# Patient Record
Sex: Male | Born: 1960 | Marital: Single
Health system: Southern US, Community
[De-identification: ages and names within clinical notes are randomized; demographics above are authoritative.]

## PROBLEM LIST (undated history)

## (undated) DIAGNOSIS — I10 Essential (primary) hypertension: Secondary | ICD-10-CM

## (undated) DIAGNOSIS — A419 Sepsis, unspecified organism: Secondary | ICD-10-CM

## (undated) DIAGNOSIS — J151 Pneumonia due to Pseudomonas: Secondary | ICD-10-CM

## (undated) DIAGNOSIS — B9562 Methicillin resistant Staphylococcus aureus infection as the cause of diseases classified elsewhere: Secondary | ICD-10-CM

## (undated) DIAGNOSIS — L89319 Pressure ulcer of right buttock, unspecified stage: Secondary | ICD-10-CM

## (undated) DIAGNOSIS — B965 Pseudomonas (aeruginosa) (mallei) (pseudomallei) as the cause of diseases classified elsewhere: Secondary | ICD-10-CM

## (undated) DIAGNOSIS — L89329 Pressure ulcer of left buttock, unspecified stage: Secondary | ICD-10-CM

## (undated) DIAGNOSIS — Z978 Presence of other specified devices: Secondary | ICD-10-CM

## (undated) DIAGNOSIS — Z8619 Personal history of other infectious and parasitic diseases: Secondary | ICD-10-CM

## (undated) DIAGNOSIS — E119 Type 2 diabetes mellitus without complications: Secondary | ICD-10-CM

## (undated) DIAGNOSIS — J9611 Chronic respiratory failure with hypoxia: Secondary | ICD-10-CM

## (undated) DIAGNOSIS — J189 Pneumonia, unspecified organism: Secondary | ICD-10-CM

## (undated) DIAGNOSIS — G8220 Paraplegia, unspecified: Secondary | ICD-10-CM

## (undated) DIAGNOSIS — G40909 Epilepsy, unspecified, not intractable, without status epilepticus: Secondary | ICD-10-CM

## (undated) DIAGNOSIS — J449 Chronic obstructive pulmonary disease, unspecified: Secondary | ICD-10-CM

## (undated) DIAGNOSIS — Z960 Presence of urogenital implants: Secondary | ICD-10-CM

## (undated) DIAGNOSIS — R7881 Bacteremia: Secondary | ICD-10-CM

## (undated) DIAGNOSIS — N390 Urinary tract infection, site not specified: Secondary | ICD-10-CM

## (undated) HISTORY — PX: TRACHEOSTOMY: SUR1362

---

## 2014-10-02 ENCOUNTER — Ambulatory Visit (HOSPITAL_COMMUNITY)
Admission: RE | Admit: 2014-10-02 | Discharge: 2014-10-02 | Disposition: A | Discharge status: Home / Self Care | Payer: Medicare Other | Source: Ambulatory Visit | Attending: Family Medicine | Admitting: Family Medicine

## 2014-10-02 ENCOUNTER — Ambulatory Visit (HOSPITAL_COMMUNITY): Payer: Medicare Other

## 2014-10-02 DIAGNOSIS — G9340 Encephalopathy, unspecified: Secondary | ICD-10-CM | POA: Diagnosis not present

## 2014-10-02 NOTE — Procedures (Signed)
EEG report.  Brief clinical history:  54 years old with spells versus seizures. Technique: this is a 17 channel routine scalp EEG performed at the bedside with bipolar and monopolar montages arranged in accordance to the international 10/20 system of electrode placement. One channel was dedicated to EKG recording.  The patient is unresponsive, intubated on the vent. No activating procedures performed.  Description: as the study opens and throughout the entire recording, there is marked and diffuse background attenuation with sporadically intermixed theta frequencies. No electrographic seizures noted.  No focal or generalized epileptiform discharges noted.  EKG showed sinus rhythm.  Impression: this is an abnormal EEG with findings consistent with a severe encephalopathy, no specific as to cause. No electrographic seizures noted. Clinical correlation is advised.   Wyatt Portelasvaldo Sherman Donaldson, MD [facility]

## 2014-10-02 NOTE — Progress Notes (Signed)
Off site [facility] completed.  Results pending.

## 2015-12-21 ENCOUNTER — Other Ambulatory Visit (HOSPITAL_COMMUNITY): Payer: Self-pay

## 2015-12-21 ENCOUNTER — Inpatient Hospital Stay
Admission: AD | Admit: 2015-12-21 | Discharge: 2016-02-03 | Disposition: A | Discharge status: Home Health | Payer: Self-pay | Source: Ambulatory Visit | Attending: Internal Medicine | Admitting: Internal Medicine

## 2015-12-21 DIAGNOSIS — Z931 Gastrostomy status: Secondary | ICD-10-CM

## 2015-12-21 DIAGNOSIS — R1310 Dysphagia, unspecified: Secondary | ICD-10-CM

## 2015-12-21 DIAGNOSIS — Z0189 Encounter for other specified special examinations: Secondary | ICD-10-CM

## 2015-12-21 DIAGNOSIS — J9690 Respiratory failure, unspecified, unspecified whether with hypoxia or hypercapnia: Secondary | ICD-10-CM

## 2015-12-21 DIAGNOSIS — Z4659 Encounter for fitting and adjustment of other gastrointestinal appliance and device: Secondary | ICD-10-CM

## 2015-12-21 HISTORY — DX: Chronic obstructive pulmonary disease, unspecified: J44.9

## 2015-12-21 HISTORY — DX: Epilepsy, unspecified, not intractable, without status epilepticus: G40.909

## 2015-12-21 HISTORY — DX: Chronic respiratory failure with hypoxia: J96.11

## 2015-12-21 HISTORY — DX: Pseudomonas (aeruginosa) (mallei) (pseudomallei) as the cause of diseases classified elsewhere: B96.5

## 2015-12-21 HISTORY — DX: Pneumonia due to Pseudomonas: J15.1

## 2015-12-21 HISTORY — DX: Pressure ulcer of right buttock, unspecified stage: L89.319

## 2015-12-21 HISTORY — DX: Pneumonia, unspecified organism: J18.9

## 2015-12-21 HISTORY — DX: Bacteremia: R78.81

## 2015-12-21 HISTORY — DX: Urinary tract infection, site not specified: N39.0

## 2015-12-21 HISTORY — DX: Paraplegia, unspecified: G82.20

## 2015-12-21 HISTORY — DX: Presence of other specified devices: Z97.8

## 2015-12-21 HISTORY — DX: Pressure ulcer of left buttock, unspecified stage: L89.329

## 2015-12-21 HISTORY — DX: Type 2 diabetes mellitus without complications: E11.9

## 2015-12-21 HISTORY — DX: Sepsis, unspecified organism: A41.9

## 2015-12-21 HISTORY — DX: Presence of urogenital implants: Z96.0

## 2015-12-21 HISTORY — DX: Essential (primary) hypertension: I10

## 2015-12-21 HISTORY — DX: Personal history of other infectious and parasitic diseases: Z86.19

## 2015-12-21 HISTORY — DX: Methicillin resistant Staphylococcus aureus infection as the cause of diseases classified elsewhere: B95.62

## 2015-12-21 LAB — BLOOD GAS, ARTERIAL
Acid-Base Excess: 2.2 mmol/L — ABNORMAL HIGH (ref 0.0–2.0)
Bicarbonate: 28.4 meq/L — ABNORMAL HIGH (ref 20.0–24.0)
FIO2: 50
MECHVT: 500 mL
O2 Saturation: 97 %
PEEP: 5 cmH2O
RATE: 16 {breaths}/min
TCO2: 30.3 mmol/L (ref 0–100)
pCO2 arterial: 63.3 mm[Hg] (ref 35.0–45.0)
pH, Arterial: 7.274 — ABNORMAL LOW (ref 7.350–7.450)
pO2, Arterial: 89.8 mm[Hg] (ref 80.0–100.0)

## 2015-12-22 ENCOUNTER — Other Ambulatory Visit (HOSPITAL_COMMUNITY): Payer: Self-pay

## 2015-12-22 LAB — CBC
HCT: 28.2 % — ABNORMAL LOW (ref 39.0–52.0)
Hemoglobin: 8 g/dL — ABNORMAL LOW (ref 13.0–17.0)
MCH: 25.8 pg — ABNORMAL LOW (ref 26.0–34.0)
MCHC: 28.4 g/dL — ABNORMAL LOW (ref 30.0–36.0)
MCV: 91 fL (ref 78.0–100.0)
Platelets: 278 10*3/uL (ref 150–400)
RBC: 3.1 MIL/uL — ABNORMAL LOW (ref 4.22–5.81)
RDW: 16.3 % — ABNORMAL HIGH (ref 11.5–15.5)
WBC: 9.6 10*3/uL (ref 4.0–10.5)

## 2015-12-22 LAB — BRAIN NATRIURETIC PEPTIDE: B Natriuretic Peptide: 44.3 pg/mL (ref 0.0–100.0)

## 2015-12-22 LAB — COMPREHENSIVE METABOLIC PANEL
ALT: 12 U/L — ABNORMAL LOW (ref 17–63)
AST: 23 U/L (ref 15–41)
Albumin: 2.1 g/dL — ABNORMAL LOW (ref 3.5–5.0)
Alkaline Phosphatase: 83 U/L (ref 38–126)
Anion gap: 5 (ref 5–15)
BUN: 58 mg/dL — ABNORMAL HIGH (ref 6–20)
CO2: 29 mmol/L (ref 22–32)
Calcium: 9.3 mg/dL (ref 8.9–10.3)
Chloride: 115 mmol/L — ABNORMAL HIGH (ref 101–111)
Creatinine, Ser: 1.04 mg/dL (ref 0.61–1.24)
GFR calc Af Amer: >60 mL/min (ref >60)
GFR calc non Af Amer: >60 mL/min (ref >60)
Glucose, Bld: 99 mg/dL (ref 65–99)
Potassium: 3.7 mmol/L (ref 3.5–5.1)
Sodium: 149 mmol/L — ABNORMAL HIGH (ref 135–145)
Total Bilirubin: 0.3 mg/dL (ref 0.3–1.2)
Total Protein: 7.8 g/dL (ref 6.5–8.1)

## 2015-12-22 LAB — C DIFFICILE QUICK SCREEN W PCR REFLEX
C Diff antigen: POSITIVE — AB
C Diff toxin: NEGATIVE

## 2015-12-22 LAB — CLOSTRIDIUM DIFFICILE BY PCR: Toxigenic C. Difficile by PCR: NEGATIVE

## 2015-12-22 LAB — PROTIME-INR
INR: 1.19
Prothrombin Time: 15.2 s (ref 11.4–15.2)

## 2015-12-23 ENCOUNTER — Encounter: Payer: Self-pay | Admitting: Acute Care

## 2015-12-23 DIAGNOSIS — E872 Acidosis: Secondary | ICD-10-CM

## 2015-12-23 DIAGNOSIS — J9601 Acute respiratory failure with hypoxia: Secondary | ICD-10-CM

## 2015-12-23 DIAGNOSIS — G9340 Encephalopathy, unspecified: Secondary | ICD-10-CM

## 2015-12-23 DIAGNOSIS — J189 Pneumonia, unspecified organism: Secondary | ICD-10-CM
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
## 2015-12-26 ENCOUNTER — Other Ambulatory Visit (HOSPITAL_COMMUNITY): Payer: Self-pay
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
## 2015-12-28 ENCOUNTER — Encounter (HOSPITAL_COMMUNITY): Payer: Self-pay | Admitting: Certified Registered"

## 2015-12-28 ENCOUNTER — Encounter
Admission: AD | Disposition: A | Discharge status: Home Health | Payer: Self-pay | Source: Ambulatory Visit | Attending: Internal Medicine

## 2015-12-28 HISTORY — PX: TRACHEOSTOMY TUBE PLACEMENT: SHX814

## 2015-12-28 SURGERY — CREATION, TRACHEOSTOMY
Anesthesia: General

## 2015-12-28 MED ORDER — PROPOFOL 10 MG/ML IV BOLUS
INTRAVENOUS | Status: AC
  Filled 2015-12-28: qty 20

## 2015-12-28 MED ORDER — LIDOCAINE-EPINEPHRINE 1 %-1:100000 IJ SOLN
INTRAMUSCULAR | Status: DC | PRN
  Administered 2015-12-28: 5 mL

## 2015-12-28 MED ORDER — ROCURONIUM BROMIDE 100 MG/10ML IV SOLN
INTRAVENOUS | Status: DC | PRN
  Administered 2015-12-28: 100 mg via INTRAVENOUS

## 2015-12-28 MED ORDER — MIDAZOLAM HCL 2 MG/2ML IJ SOLN
INTRAMUSCULAR | Status: DC | PRN
  Administered 2015-12-28: 4 mg via INTRAVENOUS

## 2015-12-28 MED ORDER — EPHEDRINE 5 MG/ML INJ
INTRAVENOUS | Status: AC
  Filled 2015-12-28: qty 10

## 2015-12-28 MED ORDER — FENTANYL CITRATE (PF) 100 MCG/2ML IJ SOLN
INTRAMUSCULAR | Status: AC
  Filled 2015-12-28: qty 4

## 2015-12-28 MED ORDER — MIDAZOLAM HCL 2 MG/2ML IJ SOLN
INTRAMUSCULAR | Status: AC
  Filled 2015-12-28: qty 2

## 2015-12-28 MED ORDER — 0.9 % SODIUM CHLORIDE (POUR BTL) OPTIME
TOPICAL | Status: DC | PRN
  Administered 2015-12-28: 1000 mL

## 2015-12-28 MED ORDER — ROCURONIUM BROMIDE 10 MG/ML (PF) SYRINGE
PREFILLED_SYRINGE | INTRAVENOUS | Status: AC
  Filled 2015-12-28: qty 10

## 2015-12-28 MED ORDER — ONDANSETRON HCL 4 MG/2ML IJ SOLN
INTRAMUSCULAR | Status: DC | PRN
  Administered 2015-12-28: 4 mg via INTRAVENOUS

## 2015-12-28 MED ORDER — PHENYLEPHRINE HCL 10 MG/ML IJ SOLN
INTRAMUSCULAR | Status: DC | PRN
  Administered 2015-12-28: 120 ug via INTRAVENOUS
  Administered 2015-12-28: 80 ug via INTRAVENOUS

## 2015-12-28 MED ORDER — PHENYLEPHRINE HCL 10 MG/ML IJ SOLN
INTRAVENOUS | Status: DC | PRN
  Administered 2015-12-28: 20 ug/min via INTRAVENOUS

## 2015-12-28 MED ORDER — FENTANYL CITRATE (PF) 100 MCG/2ML IJ SOLN
INTRAMUSCULAR | Status: DC | PRN
  Administered 2015-12-28 (×4): 50 ug via INTRAVENOUS

## 2015-12-28 MED ORDER — PHENYLEPHRINE 40 MCG/ML (10ML) SYRINGE FOR IV PUSH (FOR BLOOD PRESSURE SUPPORT)
PREFILLED_SYRINGE | INTRAVENOUS | Status: AC
  Filled 2015-12-28: qty 10

## 2015-12-28 SURGICAL SUPPLY — 41 items
ATTRACTOMAT 16X20 MAGNETIC DRP (DRAPES) IMPLANT
BLADE SURG 15 STRL LF DISP TIS (BLADE) ×1 IMPLANT
BLADE SURG 15 STRL SS (BLADE) ×3
CLEANER TIP ELECTROSURG 2X2 (MISCELLANEOUS) ×3 IMPLANT
COVER SURGICAL LIGHT HANDLE (MISCELLANEOUS) ×3 IMPLANT
DRAPE PROXIMA HALF (DRAPES) ×3 IMPLANT
ELECT COATED BLADE 2.86 ST (ELECTRODE) ×3 IMPLANT
ELECT REM PT RETURN 9FT ADLT (ELECTROSURGICAL) ×3
ELECTRODE REM PT RTRN 9FT ADLT (ELECTROSURGICAL) ×1 IMPLANT
GAUZE SPONGE 4X4 16PLY XRAY LF (GAUZE/BANDAGES/DRESSINGS) ×3 IMPLANT
GEL ULTRASOUND 20GR AQUASONIC (MISCELLANEOUS) ×3 IMPLANT
GLOVE SS BIOGEL STRL SZ 7.5 (GLOVE) ×1 IMPLANT
GLOVE SUPERSENSE BIOGEL SZ 7.5 (GLOVE) ×2
GOWN STRL REUS W/ TWL LRG LVL3 (GOWN DISPOSABLE) ×1 IMPLANT
GOWN STRL REUS W/ TWL XL LVL3 (GOWN DISPOSABLE) ×1 IMPLANT
GOWN STRL REUS W/TWL LRG LVL3 (GOWN DISPOSABLE) ×3
GOWN STRL REUS W/TWL XL LVL3 (GOWN DISPOSABLE) ×3
HOLDER TRACH TUBE VELCRO 19.5 (MISCELLANEOUS) ×3 IMPLANT
KIT BASIN OR (CUSTOM PROCEDURE TRAY) ×3 IMPLANT
KIT ROOM TURNOVER OR (KITS) ×3 IMPLANT
KIT SUCTION CATH 14FR (SUCTIONS) ×3 IMPLANT
NEEDLE HYPO 25GX1X1/2 BEV (NEEDLE) IMPLANT
NS IRRIG 1000ML POUR BTL (IV SOLUTION) ×3 IMPLANT
PACK EENT II TURBAN DRAPE (CUSTOM PROCEDURE TRAY) ×3 IMPLANT
PAD ARMBOARD 7.5X6 YLW CONV (MISCELLANEOUS) IMPLANT
PENCIL BUTTON HOLSTER BLD 10FT (ELECTRODE) ×3 IMPLANT
SPONGE DRAIN TRACH 4X4 STRL 2S (GAUZE/BANDAGES/DRESSINGS) ×3 IMPLANT
SPONGE GAUZE 4X4 12PLY STER LF (GAUZE/BANDAGES/DRESSINGS) ×3 IMPLANT
SPONGE INTESTINAL PEANUT (DISPOSABLE) ×3 IMPLANT
SUT SILK 2 0 SH CR/8 (SUTURE) ×3 IMPLANT
SUT SILK 3 0 TIES 10X30 (SUTURE) IMPLANT
SYR 20CC LL (SYRINGE) ×3 IMPLANT
SYR 5ML LUER SLIP (SYRINGE) ×3 IMPLANT
SYR CONTROL 10ML LL (SYRINGE) ×3 IMPLANT
TOWEL OR 17X24 6PK STRL BLUE (TOWEL DISPOSABLE) ×3 IMPLANT
TOWEL OR 17X26 10 PK STRL BLUE (TOWEL DISPOSABLE) ×3 IMPLANT
TUBE CONNECTING 12'X1/4 (SUCTIONS) ×1
TUBE CONNECTING 12X1/4 (SUCTIONS) ×2 IMPLANT
TUBE TRACH SHILEY  6 DIST  CUF (TUBING) ×3 IMPLANT
TUBE TRACH SHILEY 10 DIST CUFF (TUBING) IMPLANT
TUBE TRACH SHILEY 8 DIST CUF (TUBING) IMPLANT

## 2015-12-28 NOTE — Transfer of Care (Addendum)
Immediate Anesthesia Transfer of Care Note  Patient: Ruben Harrington  Procedure(s) Performed: Procedure(s): TRACHEOSTOMY (N/A)  Patient Location: ICU  Anesthesia Type:General  Level of Consciousness: sedated and unresponsive  Airway & Oxygen Therapy: Patient remains intubated per anesthesia plan and Patient placed on Ventilator (see vital sign flow sheet for setting)  Post-op Assessment: Report given to RN and Post -op Vital signs reviewed and stable  Post vital signs: Reviewed and stable  Last Vitals: There were no vitals filed for this visit.  Last Pain: There were no vitals filed for this visit.       Complications: No apparent anesthesia complications
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
## 2015-12-28 NOTE — Brief Op Note (Signed)
12/21/2015 - 12/28/2015  8:53 AM  PATIENT:  Ruben Harrington  55 y.o. male  PRE-OPERATIVE DIAGNOSIS:  respiratory failure  POST-OPERATIVE DIAGNOSIS:  respiratory failure  PROCEDURE:  Procedure(s): TRACHEOSTOMY (N/A) # 6 Shiley  SURGEON:  Surgeon(s) and Role:    * Drema Halonhristopher E Parag Dorton, MD - Primary  PHYSICIAN ASSISTANT:   ASSISTANTS: none   ANESTHESIA:   general  EBL:  Total I/O In: -  Out: 25 [Blood:25]  BLOOD ADMINISTERED:none  DRAINS: none   LOCAL MEDICATIONS USED:  XYLOCAINE with EPI 5 cc  SPECIMEN:  No Specimen  DISPOSITION OF SPECIMEN:  N/A  COUNTS:  YES  TOURNIQUET:  * No tourniquets in log *  DICTATION: .Other Dictation: Dictation Number [id number]  PLAN OF CARE: Discharge to home after PACU  PATIENT DISPOSITION:  PACU - hemodynamically stable.   Delay start of Pharmacological VTE agent (>24hrs) due to surgical blood loss or risk of bleeding: yes

## 2015-12-28 NOTE — Anesthesia Procedure Notes (Signed)
Date/Time: 12/28/2015 7:45 AM Performed by: Rosiland OzMEYERS, Ricquel Foulk Pre-anesthesia Checklist: Patient identified, Emergency Drugs available, Suction available, Patient being monitored and Timeout performed Patient Re-evaluated:Patient Re-evaluated prior to inductionOxygen Delivery Method: Circle system utilized Preoxygenation: Pre-oxygenation with 100% oxygen Intubation Type: Inhalational induction with existing ETT

## 2015-12-29 ENCOUNTER — Encounter (HOSPITAL_COMMUNITY): Payer: Self-pay | Admitting: Otolaryngology
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
## 2016-01-01 ENCOUNTER — Other Ambulatory Visit (HOSPITAL_COMMUNITY): Payer: Self-pay

## 2016-01-01 LAB — BASIC METABOLIC PANEL
Anion gap: 5 (ref 5–15)
BUN: 50 mg/dL — ABNORMAL HIGH (ref 6–20)
CO2: 28 mmol/L (ref 22–32)
Calcium: 9.2 mg/dL (ref 8.9–10.3)
Chloride: 106 mmol/L (ref 101–111)
Creatinine, Ser: 1.07 mg/dL (ref 0.61–1.24)
GFR calc Af Amer: >60 mL/min (ref >60)
GFR calc non Af Amer: >60 mL/min (ref >60)
Glucose, Bld: 113 mg/dL — ABNORMAL HIGH (ref 65–99)
Potassium: 5.2 mmol/L — ABNORMAL HIGH (ref 3.5–5.1)
Sodium: 139 mmol/L (ref 135–145)

## 2016-01-01 LAB — CBC
HCT: 31.6 % — ABNORMAL LOW (ref 39.0–52.0)
Hemoglobin: 9.1 g/dL — ABNORMAL LOW (ref 13.0–17.0)
MCH: 25.1 pg — ABNORMAL LOW (ref 26.0–34.0)
MCHC: 28.8 g/dL — ABNORMAL LOW (ref 30.0–36.0)
MCV: 87.3 fL (ref 78.0–100.0)
Platelets: 233 10*3/uL (ref 150–400)
RBC: 3.62 MIL/uL — ABNORMAL LOW (ref 4.22–5.81)
RDW: 16.6 % — ABNORMAL HIGH (ref 11.5–15.5)
WBC: 10.1 10*3/uL (ref 4.0–10.5)

## 2016-01-03 ENCOUNTER — Other Ambulatory Visit (HOSPITAL_COMMUNITY): Payer: Self-pay

## 2016-01-03 LAB — CBC WITH DIFFERENTIAL/PLATELET
Basophils Absolute: 0.1 10*3/uL (ref 0.0–0.1)
Basophils Relative: 1 %
Eosinophils Absolute: 0.3 10*3/uL (ref 0.0–0.7)
Eosinophils Relative: 3 %
HCT: 29.4 % — ABNORMAL LOW (ref 39.0–52.0)
Hemoglobin: 8.3 g/dL — ABNORMAL LOW (ref 13.0–17.0)
Lymphocytes Relative: 32 %
Lymphs Abs: 2.8 10*3/uL (ref 0.7–4.0)
MCH: 24.8 pg — ABNORMAL LOW (ref 26.0–34.0)
MCHC: 28.2 g/dL — ABNORMAL LOW (ref 30.0–36.0)
MCV: 87.8 fL (ref 78.0–100.0)
Monocytes Absolute: 0.8 10*3/uL (ref 0.1–1.0)
Monocytes Relative: 9 %
Neutro Abs: 4.8 10*3/uL (ref 1.7–7.7)
Neutrophils Relative %: 55 %
Platelets: 234 10*3/uL (ref 150–400)
RBC: 3.35 MIL/uL — ABNORMAL LOW (ref 4.22–5.81)
RDW: 16.8 % — ABNORMAL HIGH (ref 11.5–15.5)
WBC: 8.7 10*3/uL (ref 4.0–10.5)

## 2016-01-03 LAB — BASIC METABOLIC PANEL
Anion gap: 4 — ABNORMAL LOW (ref 5–15)
BUN: 49 mg/dL — ABNORMAL HIGH (ref 6–20)
CO2: 32 mmol/L (ref 22–32)
Calcium: 9.1 mg/dL (ref 8.9–10.3)
Chloride: 106 mmol/L (ref 101–111)
Creatinine, Ser: 1.18 mg/dL (ref 0.61–1.24)
GFR calc Af Amer: >60 mL/min (ref >60)
GFR calc non Af Amer: >60 mL/min (ref >60)
Glucose, Bld: 116 mg/dL — ABNORMAL HIGH (ref 65–99)
Potassium: 5.2 mmol/L — ABNORMAL HIGH (ref 3.5–5.1)
Sodium: 142 mmol/L (ref 135–145)

## 2016-01-04 LAB — BASIC METABOLIC PANEL
Anion gap: 4 — ABNORMAL LOW (ref 5–15)
BUN: 49 mg/dL — ABNORMAL HIGH (ref 6–20)
CO2: 31 mmol/L (ref 22–32)
Calcium: 9.4 mg/dL (ref 8.9–10.3)
Chloride: 106 mmol/L (ref 101–111)
Creatinine, Ser: 1.11 mg/dL (ref 0.61–1.24)
GFR calc Af Amer: >60 mL/min (ref >60)
GFR calc non Af Amer: >60 mL/min (ref >60)
Glucose, Bld: 105 mg/dL — ABNORMAL HIGH (ref 65–99)
Potassium: 5.2 mmol/L — ABNORMAL HIGH (ref 3.5–5.1)
Sodium: 141 mmol/L (ref 135–145)

## 2016-01-05 LAB — CULTURE, RESPIRATORY W GRAM STAIN

## 2016-01-06 ENCOUNTER — Other Ambulatory Visit (HOSPITAL_COMMUNITY): Payer: Self-pay

## 2016-01-07 LAB — BLOOD GAS, ARTERIAL
Acid-Base Excess: 3.8 mmol/L — ABNORMAL HIGH (ref 0.0–2.0)
Bicarbonate: 29.8 mmol/L — ABNORMAL HIGH (ref 20.0–28.0)
FIO2: 100
O2 Saturation: 84.9 %
Patient temperature: 98.6
pCO2 arterial: 62.5 mm[Hg] — ABNORMAL HIGH (ref 32.0–48.0)
pH, Arterial: 7.299 — ABNORMAL LOW (ref 7.350–7.450)
pO2, Arterial: 57 mm[Hg] — ABNORMAL LOW (ref 83.0–108.0)

## 2016-01-08 ENCOUNTER — Other Ambulatory Visit (HOSPITAL_COMMUNITY): Payer: Self-pay

## 2016-01-10 LAB — BASIC METABOLIC PANEL
Anion gap: 6 (ref 5–15)
BUN: 20 mg/dL (ref 6–20)
CO2: 26 mmol/L (ref 22–32)
Calcium: 7.6 mg/dL — ABNORMAL LOW (ref 8.9–10.3)
Chloride: 108 mmol/L (ref 101–111)
Creatinine, Ser: 0.76 mg/dL (ref 0.61–1.24)
GFR calc Af Amer: >60 mL/min (ref >60)
GFR calc non Af Amer: >60 mL/min (ref >60)
Glucose, Bld: 73 mg/dL (ref 65–99)
Potassium: 3.7 mmol/L (ref 3.5–5.1)
Sodium: 140 mmol/L (ref 135–145)

## 2016-01-13 ENCOUNTER — Other Ambulatory Visit (HOSPITAL_COMMUNITY): Payer: Self-pay

## 2016-01-15 ENCOUNTER — Other Ambulatory Visit (HOSPITAL_COMMUNITY): Payer: Self-pay

## 2016-01-15 LAB — BASIC METABOLIC PANEL
Anion gap: 5 (ref 5–15)
BUN: 32 mg/dL — ABNORMAL HIGH (ref 6–20)
CO2: 31 mmol/L (ref 22–32)
Calcium: 9.1 mg/dL (ref 8.9–10.3)
Chloride: 100 mmol/L — ABNORMAL LOW (ref 101–111)
Creatinine, Ser: 0.88 mg/dL (ref 0.61–1.24)
GFR calc Af Amer: >60 mL/min (ref >60)
GFR calc non Af Amer: >60 mL/min (ref >60)
Glucose, Bld: 116 mg/dL — ABNORMAL HIGH (ref 65–99)
Potassium: 5.1 mmol/L (ref 3.5–5.1)
Sodium: 136 mmol/L (ref 135–145)

## 2016-01-15 LAB — CBC
HCT: 31.1 % — ABNORMAL LOW (ref 39.0–52.0)
Hemoglobin: 9 g/dL — ABNORMAL LOW (ref 13.0–17.0)
MCH: 25.5 pg — ABNORMAL LOW (ref 26.0–34.0)
MCHC: 28.9 g/dL — ABNORMAL LOW (ref 30.0–36.0)
MCV: 88.1 fL (ref 78.0–100.0)
Platelets: 229 10*3/uL (ref 150–400)
RBC: 3.53 MIL/uL — ABNORMAL LOW (ref 4.22–5.81)
RDW: 17.4 % — ABNORMAL HIGH (ref 11.5–15.5)
WBC: 7.9 10*3/uL (ref 4.0–10.5)

## 2016-01-19 ENCOUNTER — Other Ambulatory Visit (HOSPITAL_COMMUNITY): Payer: Self-pay

## 2016-01-19 LAB — CBC
HCT: 32.4 % — ABNORMAL LOW (ref 39.0–52.0)
Hemoglobin: 9.6 g/dL — ABNORMAL LOW (ref 13.0–17.0)
MCH: 25.5 pg — ABNORMAL LOW (ref 26.0–34.0)
MCHC: 29.6 g/dL — ABNORMAL LOW (ref 30.0–36.0)
MCV: 86.2 fL (ref 78.0–100.0)
Platelets: 212 K/uL (ref 150–400)
RBC: 3.76 MIL/uL — ABNORMAL LOW (ref 4.22–5.81)
RDW: 17.3 % — ABNORMAL HIGH (ref 11.5–15.5)
WBC: 11.8 K/uL — ABNORMAL HIGH (ref 4.0–10.5)

## 2016-01-19 LAB — BASIC METABOLIC PANEL WITH GFR
Anion gap: 10 (ref 5–15)
BUN: 65 mg/dL — ABNORMAL HIGH (ref 6–20)
CO2: 28 mmol/L (ref 22–32)
Calcium: 8.9 mg/dL (ref 8.9–10.3)
Chloride: 98 mmol/L — ABNORMAL LOW (ref 101–111)
Creatinine, Ser: 1.14 mg/dL (ref 0.61–1.24)
GFR calc Af Amer: >60 mL/min (ref >60)
GFR calc non Af Amer: >60 mL/min (ref >60)
Glucose, Bld: 238 mg/dL — ABNORMAL HIGH (ref 65–99)
Potassium: 4.9 mmol/L (ref 3.5–5.1)
Sodium: 136 mmol/L (ref 135–145)

## 2016-01-22 ENCOUNTER — Other Ambulatory Visit (HOSPITAL_COMMUNITY): Payer: Self-pay

## 2016-01-22 LAB — CBC
HCT: 36.8 % — ABNORMAL LOW (ref 39.0–52.0)
Hemoglobin: 11 g/dL — ABNORMAL LOW (ref 13.0–17.0)
MCH: 25.3 pg — ABNORMAL LOW (ref 26.0–34.0)
MCHC: 29.9 g/dL — ABNORMAL LOW (ref 30.0–36.0)
MCV: 84.8 fL (ref 78.0–100.0)
Platelets: 194 10*3/uL (ref 150–400)
RBC: 4.34 MIL/uL (ref 4.22–5.81)
RDW: 17.2 % — ABNORMAL HIGH (ref 11.5–15.5)
WBC: 12.9 10*3/uL — ABNORMAL HIGH (ref 4.0–10.5)

## 2016-01-22 LAB — BASIC METABOLIC PANEL
Anion gap: 10 (ref 5–15)
BUN: 63 mg/dL — ABNORMAL HIGH (ref 6–20)
CO2: 27 mmol/L (ref 22–32)
Calcium: 8.9 mg/dL (ref 8.9–10.3)
Chloride: 98 mmol/L — ABNORMAL LOW (ref 101–111)
Creatinine, Ser: 1.12 mg/dL (ref 0.61–1.24)
GFR calc Af Amer: >60 mL/min (ref >60)
GFR calc non Af Amer: >60 mL/min (ref >60)
Glucose, Bld: 245 mg/dL — ABNORMAL HIGH (ref 65–99)
Potassium: 4.6 mmol/L (ref 3.5–5.1)
Sodium: 135 mmol/L (ref 135–145)

## 2016-01-24 LAB — CULTURE, RESPIRATORY W GRAM STAIN: Culture: NORMAL

## 2016-01-27 LAB — MAGNESIUM: Magnesium: 1.9 mg/dL (ref 1.7–2.4)

## 2016-01-27 LAB — POTASSIUM: Potassium: 4.7 mmol/L (ref 3.5–5.1)

## 2016-01-30 LAB — CBC
HCT: 36.8 % — ABNORMAL LOW (ref 39.0–52.0)
Hemoglobin: 10.9 g/dL — ABNORMAL LOW (ref 13.0–17.0)
MCH: 25.3 pg — ABNORMAL LOW (ref 26.0–34.0)
MCHC: 29.6 g/dL — ABNORMAL LOW (ref 30.0–36.0)
MCV: 85.6 fL (ref 78.0–100.0)
Platelets: 182 10*3/uL (ref 150–400)
RBC: 4.3 MIL/uL (ref 4.22–5.81)
RDW: 17.3 % — ABNORMAL HIGH (ref 11.5–15.5)
WBC: 10.5 10*3/uL (ref 4.0–10.5)

## 2016-01-30 LAB — BASIC METABOLIC PANEL
Anion gap: 10 (ref 5–15)
BUN: 50 mg/dL — ABNORMAL HIGH (ref 6–20)
CO2: 28 mmol/L (ref 22–32)
Calcium: 9.2 mg/dL (ref 8.9–10.3)
Chloride: 96 mmol/L — ABNORMAL LOW (ref 101–111)
Creatinine, Ser: 1.05 mg/dL (ref 0.61–1.24)
GFR calc Af Amer: >60 mL/min (ref >60)
GFR calc non Af Amer: >60 mL/min (ref >60)
Glucose, Bld: 99 mg/dL (ref 65–99)
Potassium: 4.1 mmol/L (ref 3.5–5.1)
Sodium: 134 mmol/L — ABNORMAL LOW (ref 135–145)

## 2016-02-18 ENCOUNTER — Institutional Professional Consult (permissible substitution): Payer: Self-pay | Admitting: Internal Medicine

## 2016-03-08 ENCOUNTER — Institutional Professional Consult (permissible substitution): Payer: Self-pay | Admitting: Internal Medicine

## 2016-12-23 IMAGING — CR DG CHEST 1V PORT
1 series · 1 of 1 positions shown · non-contrast
Comparison: 01/19/2016.

CLINICAL DATA: Cough.

EXAM:
PORTABLE CHEST 1 VIEW

[AP]
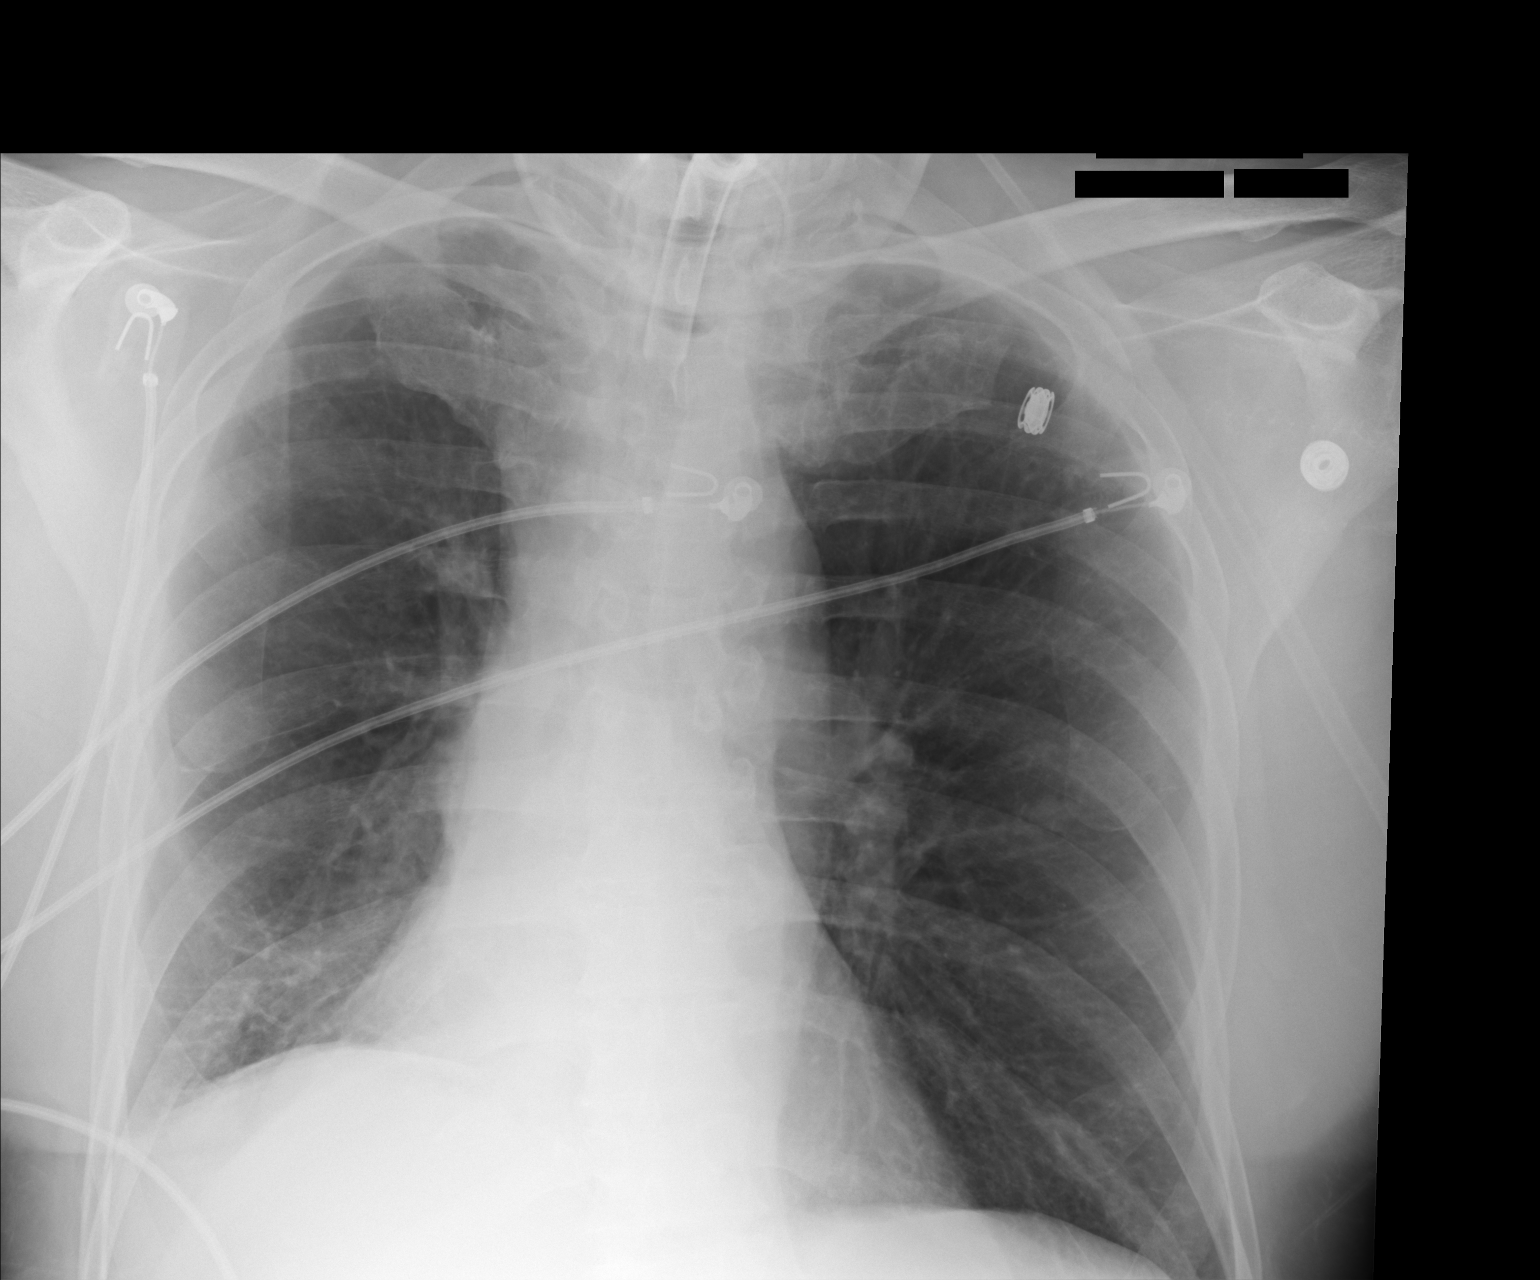

[1 of 1 positions shown; findings below may reference images not displayed]

FINDINGS: Tracheostomy tube is in good anatomic position. Heart size normal.
Right lower lobe atelectasis. Mild right base infiltrate cannot be
excluded. No pleural effusion or pneumothorax.
IMPRESSION: 1.  Tracheostomy tube is in stable position.

2. Persistent right lower lobe atelectasis. Mild right base
infiltrate. Right base pneumonia cannot be excluded.

## 2018-10-01 DEATH — deceased
